# Patient Record
Sex: Female | Born: 1995 | Race: Black or African American | Hispanic: No | Marital: Single | Smoking: Never smoker
Health system: Southern US, Community
[De-identification: ages and names within clinical notes are randomized; demographics above are authoritative.]

## PROBLEM LIST (undated history)

## (undated) DIAGNOSIS — L309 Dermatitis, unspecified: Secondary | ICD-10-CM

## (undated) DIAGNOSIS — L509 Urticaria, unspecified: Secondary | ICD-10-CM

## (undated) DIAGNOSIS — Z91018 Allergy to other foods: Secondary | ICD-10-CM

## (undated) DIAGNOSIS — J45909 Unspecified asthma, uncomplicated: Secondary | ICD-10-CM

## (undated) HISTORY — DX: Dermatitis, unspecified: L30.9

## (undated) HISTORY — PX: TONSILLECTOMY: SUR1361

## (undated) HISTORY — DX: Urticaria, unspecified: L50.9

## (undated) HISTORY — DX: Allergy to other foods: Z91.018

---

## 2016-06-10 ENCOUNTER — Encounter: Payer: Self-pay | Admitting: Student

## 2016-06-10 ENCOUNTER — Ambulatory Visit (INDEPENDENT_AMBULATORY_CARE_PROVIDER_SITE_OTHER): Payer: Managed Care, Other (non HMO) | Admitting: Student

## 2016-06-10 ENCOUNTER — Other Ambulatory Visit: Payer: Self-pay

## 2016-06-10 VITALS — BP 110/70 | Ht 68.0 in | Wt 158.0 lb

## 2016-06-10 DIAGNOSIS — M25532 Pain in left wrist: Secondary | ICD-10-CM | POA: Diagnosis not present

## 2016-06-10 DIAGNOSIS — M25539 Pain in unspecified wrist: Secondary | ICD-10-CM

## 2016-06-10 DIAGNOSIS — M2550 Pain in unspecified joint: Secondary | ICD-10-CM | POA: Insufficient documentation

## 2016-06-10 DIAGNOSIS — Z832 Family history of diseases of the blood and blood-forming organs and certain disorders involving the immune mechanism: Secondary | ICD-10-CM

## 2016-06-10 DIAGNOSIS — M654 Radial styloid tenosynovitis [de Quervain]: Secondary | ICD-10-CM | POA: Diagnosis not present

## 2016-06-10 DIAGNOSIS — Z8269 Family history of other diseases of the musculoskeletal system and connective tissue: Secondary | ICD-10-CM | POA: Insufficient documentation

## 2016-06-10 LAB — COMPREHENSIVE METABOLIC PANEL
ALT: 10 U/L (ref 5–32)
AST: 15 U/L (ref 12–32)
Albumin: 4.1 g/dL (ref 3.6–5.1)
Alkaline Phosphatase: 53 U/L (ref 47–176)
BUN: 7 mg/dL (ref 7–20)
CHLORIDE: 107 mmol/L (ref 98–110)
CO2: 25 mmol/L (ref 20–31)
Calcium: 9.8 mg/dL (ref 8.9–10.4)
Creat: 0.74 mg/dL (ref 0.50–1.00)
GLUCOSE: 90 mg/dL (ref 65–99)
POTASSIUM: 4.4 mmol/L (ref 3.8–5.1)
Sodium: 140 mmol/L (ref 135–146)
Total Bilirubin: 0.6 mg/dL (ref 0.2–1.1)
Total Protein: 6.7 g/dL (ref 6.3–8.2)

## 2016-06-10 LAB — CBC
HEMATOCRIT: 39 % (ref 35.0–45.0)
HEMOGLOBIN: 12.6 g/dL (ref 11.7–15.5)
MCH: 27 pg (ref 27.0–33.0)
MCHC: 32.3 g/dL (ref 32.0–36.0)
MCV: 83.5 fL (ref 80.0–100.0)
MPV: 10.4 fL (ref 7.5–12.5)
Platelets: 411 10*3/uL — ABNORMAL HIGH (ref 140–400)
RBC: 4.67 MIL/uL (ref 3.80–5.10)
RDW: 15 % (ref 11.0–15.0)
WBC: 5 10*3/uL (ref 3.8–10.8)

## 2016-06-10 MED ORDER — PILOCARPINE HCL 5 MG PO TABS: 5.0000 mg | ORAL_TABLET | Freq: Two times a day (BID) | ORAL | 1 refills | Status: DC

## 2016-06-10 MED ORDER — MELOXICAM 15 MG PO TABS: 15.0000 mg | ORAL_TABLET | Freq: Every day | ORAL | 0 refills | Status: AC

## 2016-06-10 NOTE — Assessment & Plan Note (Signed)
Ultrasound did show swelling of the first compartment. 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 We'll place her in a thumb spica splint and recommend no practice for the next 1-2 weeks. Will follow-up next week to reassess. [facility] Did review more aggressive management that includes an injection. Decided this time to not go forward with the injection but will consider it if rest and NSAIDs and ice do not help.

## 2016-06-10 NOTE — Assessment & Plan Note (Signed)
Due to her pain in multiple joints and family history of lupus, we will check lab work including an ANA, rheumatoid factor, CBC, CMP, ESR, CRP, anti-double-stranded DNA, anti-CCP, anti-Ro and anti-La.  will follow up with her next week and training room to discuss results.  May consider rheumatology referral of continuing to have in terms. She also had decreased elevation which could be indicative of Sjogren's. Will consider sending her to Dr. Berna Bue for Dr. Leigh Aurora.  Will prescribe Salagen for her decreased salivation.

## 2016-06-10 NOTE — Progress Notes (Signed)
Christina Cain - 20 y.o. female MRN [id number]  Date of birth: January 24, 1996  SUBJECTIVE:  Including CC & ROS.  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   She states that it became very swollen and tender. She was unable to practice after that. She also complains of right shoulder pain and left ankle pain. She states that her mother had lupus and she presented similarly. She denies any rashes. She denies any fevers or chills or changes in weight.  She does report decreased salivation but denies any dry eyes. She has had recent cavities and problems with dentition.   denies any numbness or tingling distally. Her right shoulder is not bothering her at this time. Her left ankle hurts posteriorly only when she is exercising a lot. No swelling of her ankle at this time but she did have previous swelling there.   ROS: No unexpected weight loss, fever, chills, swelling, instability, muscle pain, numbness/tingling, redness, otherwise see HPI   PMHx - Updated and reviewed.  Contributory factors include: Negative PSHx - Updated and reviewed.  Contributory factors include:  Negative FHx - Updated and reviewed.  Contributory factors include:   significant for lupus in her mother Social Hx - Updated and reviewed. Contributory factors include: Negative Medications - reviewed   DATA REVIEWED: none  Ultrasound: Limited ultrasound of the left wrist was performed. First compartment was viewed and long and short axis was just revealed swelling around the abductor pollicis longus and extensor pollicis brevis tendons. This was viewed and long and short axis. This is consistent with de Quervain's tenosynovitis.  PHYSICAL EXAM:  VS: BP:110/70  HR: bpm  TEMP: ( )  RESP:   HT:5' 8"  (172.7 cm)   WT:158 lb (71.7 kg)  BMI:24.1 PHYSICAL EXAM: Gen: NAD, alert, cooperative with exam, well-appearing HEENT: clear conjunctiva,  CV:  no edema, capillary  refill brisk, normal rate Resp: non-labored Skin: no rashes, normal turgor  Neuro: no gross deficits.  Psych:  alert and oriented  Wrist: No swelling or effusion seen on inspection Tenderness to palpation on the lateral aspect of the wrist. Not tender in the anatomic snuffbox. Did have some tenderness proximal to the dorsal aspect of her carpal bones on the radial side. She has full range of motion of her bilateral wrists in flexion, extension, supination, pronation, ulnar and radial deviation. Finkelstein's test positive Grip strength equal on both sides   ASSESSMENT & PLAN:   De Quervain's tenosynovitis, left Ultrasound did show swelling of the first compartment. 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 We'll place her in a thumb spica splint and recommend no practice for the next 1-2 weeks. Will follow-up next week to reassess. Did send in a prescription for [facility] for anti-inflammatories. Did review more aggressive management that includes an injection. Decided this time to not go forward with the injection but will consider it if rest and NSAIDs and ice do not help.  Pain in joint involving multiple sites Due to her pain in multiple joints and family history of lupus, we will check lab work including an ANA, rheumatoid factor, CBC, CMP, ESR, CRP, anti-double-stranded DNA, anti-CCP, anti-Ro and anti-La.  will follow up with her next week and training room to discuss results.  May consider rheumatology referral of continuing to have in terms. She also had decreased elevation which could be indicative of Sjogren's. Will consider sending her to Dr. Berna Bue for  Dr. Leigh Aurora.  Will prescribe Salagen for her decreased salivation.

## 2016-06-11 LAB — ANTI-NUCLEAR AB-TITER (ANA TITER)

## 2016-06-11 LAB — RHEUMATOID FACTOR

## 2016-06-11 LAB — SJOGREN'S SYNDROME ANTIBODS(SSA + SSB)
SSA (Ro) (ENA) Antibody, IgG: <1
SSB (La) (ENA) Antibody, IgG: <1

## 2016-06-11 LAB — ANTI-DNA ANTIBODY, DOUBLE-STRANDED: ds DNA Ab: <1 IU/mL

## 2016-06-11 LAB — C-REACTIVE PROTEIN: CRP: 0.9 mg/L (ref <8.0)

## 2016-06-11 LAB — SEDIMENTATION RATE: Sed Rate: 4 mm/hr (ref 0–20)

## 2016-06-11 LAB — ANA: Anti Nuclear Antibody(ANA): POSITIVE — AB

## 2016-06-11 LAB — CYCLIC CITRUL PEPTIDE ANTIBODY, IGG

## 2016-06-16 ENCOUNTER — Other Ambulatory Visit: Payer: Self-pay | Admitting: *Deleted

## 2016-06-16 DIAGNOSIS — Z8269 Family history of other diseases of the musculoskeletal system and connective tissue: Secondary | ICD-10-CM

## 2016-06-16 NOTE — Progress Notes (Signed)
We are referring pt to rheumatology- preferably Dierdre ForthBeekman or Nickola MajorHawkes? for +ANA and FHx of lupus. Will fax notes and demos to Lawson FiscalLori at Rheumatology at [phone]. They will call patient with appt time and date. Their office number is [phone]

## 2016-07-13 ENCOUNTER — Emergency Department (HOSPITAL_COMMUNITY): Payer: Managed Care, Other (non HMO)

## 2016-07-13 ENCOUNTER — Emergency Department (HOSPITAL_COMMUNITY)
Admission: EM | Admit: 2016-07-13 | Discharge: 2016-07-13 | Disposition: A | Discharge status: Home / Self Care | Payer: Managed Care, Other (non HMO) | Attending: Emergency Medicine | Admitting: Emergency Medicine

## 2016-07-13 ENCOUNTER — Encounter (HOSPITAL_COMMUNITY): Payer: Self-pay | Admitting: *Deleted

## 2016-07-13 DIAGNOSIS — R0789 Other chest pain: Secondary | ICD-10-CM | POA: Diagnosis not present

## 2016-07-13 DIAGNOSIS — Y929 Unspecified place or not applicable: Secondary | ICD-10-CM | POA: Diagnosis not present

## 2016-07-13 DIAGNOSIS — Y9368 Activity, volleyball (beach) (court): Secondary | ICD-10-CM | POA: Diagnosis not present

## 2016-07-13 DIAGNOSIS — R109 Unspecified abdominal pain: Secondary | ICD-10-CM | POA: Diagnosis present

## 2016-07-13 DIAGNOSIS — W2106XA Struck by volleyball, initial encounter: Secondary | ICD-10-CM | POA: Insufficient documentation

## 2016-07-13 DIAGNOSIS — Y999 Unspecified external cause status: Secondary | ICD-10-CM | POA: Diagnosis not present

## 2016-07-13 LAB — URINALYSIS, ROUTINE W REFLEX MICROSCOPIC
BILIRUBIN URINE: NEGATIVE
Glucose, UA: NEGATIVE mg/dL
Hgb urine dipstick: NEGATIVE
KETONES UR: NEGATIVE mg/dL
Leukocytes, UA: NEGATIVE
NITRITE: NEGATIVE
PH: 5.5 (ref 5.0–8.0)
Protein, ur: NEGATIVE mg/dL
Specific Gravity, Urine: 1.019 (ref 1.005–1.030)

## 2016-07-13 LAB — CBC
HCT: 35.8 % — ABNORMAL LOW (ref 36.0–46.0)
HEMOGLOBIN: 11.5 g/dL — AB (ref 12.0–15.0)
MCH: 26.6 pg (ref 26.0–34.0)
MCHC: 32.1 g/dL (ref 30.0–36.0)
MCV: 82.7 fL (ref 78.0–100.0)
Platelets: 397 10*3/uL (ref 150–400)
RBC: 4.33 MIL/uL (ref 3.87–5.11)
RDW: 14.1 % (ref 11.5–15.5)
WBC: 8.7 10*3/uL (ref 4.0–10.5)

## 2016-07-13 LAB — I-STAT TROPONIN, ED: TROPONIN I, POC: 0 ng/mL (ref 0.00–0.08)

## 2016-07-13 LAB — COMPREHENSIVE METABOLIC PANEL
ALT: 19 U/L (ref 14–54)
ANION GAP: 10 (ref 5–15)
AST: 29 U/L (ref 15–41)
Albumin: 4.3 g/dL (ref 3.5–5.0)
Alkaline Phosphatase: 56 U/L (ref 38–126)
BILIRUBIN TOTAL: 0.7 mg/dL (ref 0.3–1.2)
BUN: 10 mg/dL (ref 6–20)
CO2: 26 mmol/L (ref 22–32)
Calcium: 10.1 mg/dL (ref 8.9–10.3)
Chloride: 104 mmol/L (ref 101–111)
Creatinine, Ser: 0.79 mg/dL (ref 0.44–1.00)
Glucose, Bld: 96 mg/dL (ref 65–99)
POTASSIUM: 3.8 mmol/L (ref 3.5–5.1)
Sodium: 140 mmol/L (ref 135–145)
TOTAL PROTEIN: 7 g/dL (ref 6.5–8.1)

## 2016-07-13 LAB — I-STAT BETA HCG BLOOD, ED (MC, WL, AP ONLY): I-stat hCG, quantitative: <5 m[IU]/mL (ref <5)

## 2016-07-13 NOTE — ED Notes (Addendum)
Pt provided with d/c instructions at this time. Pt verbalizes understanding of d/c instructions as well as follow up procedure after d/c. No new RX at time of d/c.  Pt in no apparent distress at this time. Pt ambulatory at time of d/c.   

## 2016-07-13 NOTE — ED Triage Notes (Signed)
Patient presents with c/o chest pain.  Patient was playing volleyball and got hit last Thursday.  Patient c/o pain to the central chest area.  Was seen by Armen PickupMurphy Wanier PA and was sent here to have a CT done to make sure she did not have a broken rib and haas a lung or heart contusion  Patient states she cannot take a deep breath

## 2016-07-13 NOTE — ED Provider Notes (Signed)
[facility] Provider Note   CSN: [id number] Arrival date & time: 07/13/16  1957     History   Chief Complaint Chief Complaint  Patient presents with  . Chest Pain    HPI Christina Cain is a 20 y.o. female.  HPI   20 year old female presents today with complaints of chest pain. Patient reports that 6 days ago she was playing volleyball and was struck in the chest by volleyball. She had since that time she's had anterior chest pain located over the left upper breast. She reports that she has been able to practice, but has significant pain with respirations and palpitations. She reports. Minimal pain with small respirations, denies any sushi and shortness of breath. She has any abdominal pain, dizziness, lightheadedness. Seen by orthopedics who recommended chest x-ray. She denies any other complaints, currently taking meloxicam.    History reviewed. No pertinent past medical history.  Patient Active Problem List   Diagnosis Date Noted  . De Quervain's tenosynovitis, left 06/10/2016  . Pain in joint involving multiple sites 06/10/2016  . Family history of systemic lupus erythematosus (SLE) in mother 06/10/2016    Past Surgical History:  Procedure Laterality Date  . TONSILLECTOMY      OB History    No data available       Home Medications    Prior to Admission medications   Medication Sig Start Date End Date Taking? Authorizing Provider  desogestrel-ethinyl estradiol (APRI,EMOQUETTE,SOLIA) 0.15-30 MG-MCG tablet Take by mouth. 03/23/16   Historical Provider, MD  EPINEPHrine 0.3 mg/0.3 mL IJ SOAJ injection EPIPEN 2-PAK 0.3 MG/0.3ML SOAJ                      02/17/15   Historical Provider, MD  meloxicam (MOBIC) 15 MG tablet Take 1 tablet (15 mg total) by mouth daily. 06/10/16 06/10/17  Alicia B Chitanand, DO  pilocarpine (SALAGEN) 5 MG tablet Take 1 tablet (5 mg total) by mouth 2 (two) times daily. 06/10/16   Gershon Cull, DO    Family History No family history  on file.  Social History Social History  Substance Use Topics  . Smoking status: Never Smoker  . Smokeless tobacco: Never Used  . Alcohol use No     Allergies   Shrimp [shellfish allergy]   Review of Systems Review of Systems  All other systems reviewed and are negative.   Physical Exam Updated Vital Signs BP 98/60   Pulse 65   Temp 98.9 F (37.2 C)   Resp 18   Ht 5\' 8"  (1.727 m)   Wt 72.6 kg   LMP 07/10/2016   SpO2 95%   BMI 24.33 kg/m   Physical Exam  Constitutional: She is oriented to person, place, and time. She appears well-developed and well-nourished.  HENT:  Head: Normocephalic and atraumatic.  Eyes: Conjunctivae are normal. Pupils are equal, round, and reactive to light. Right eye exhibits no discharge. Left eye exhibits no discharge. No scleral icterus.  Neck: Normal range of motion. No JVD present. No tracheal deviation present.  Cardiovascular: Normal rate, regular rhythm, normal heart sounds and intact distal pulses.  Exam reveals no gallop and no friction rub.   No murmur heard. Pulmonary/Chest: Effort normal. No stridor.  No obvious signs of trauma, tenderness to palpation of the left upper chest and superior breast., Lung expansion normal, lungs sounds clear and heard throughout.  Neurological: She is alert and oriented to person, place, and time. Coordination normal.  Psychiatric: She  has a normal mood and affect. Her behavior is normal. Judgment and thought content normal.  Nursing note and vitals reviewed.    ED Treatments / Results  Labs (all labs ordered are listed, but only abnormal results are displayed) Labs Reviewed  CBC - Abnormal; Notable for the following:       Result Value   Hemoglobin 11.5 (*)    HCT 35.8 (*)    All other components within normal limits  URINALYSIS, ROUTINE W REFLEX MICROSCOPIC (NOT AT [facility]) - Abnormal; Notable for the following:    Color, Urine AMBER (*)    All other components within normal limits    COMPREHENSIVE METABOLIC PANEL  I-STAT TROPOININ, ED  I-STAT BETA HCG BLOOD, ED ([facility], [facility], [facility] ONLY)    EKG  EKG Interpretation None       Radiology Dg Chest 2 View  Result Date: 07/13/2016 CLINICAL DATA:  Central chest pain and shortness of breath over the last 5 days after being struck in the chest by a volleyball. EXAM: CHEST  2 VIEW COMPARISON:  None. FINDINGS: Heart size is normal. Mediastinal shadows are normal. The lungs are clear. No bronchial thickening. No infiltrate, mass, effusion or collapse. Pulmonary vascularity is normal. No acute bone finding. Very mild spinal curvature. IMPRESSION: Normal chest. I cannot identify any sternal or anterior rib abnormality on this exam. The patient does show very mild spinal curvature. Electronically Signed   By: Paulina FusiMark  Shogry M.D.   On: 07/13/2016 20:53    Procedures Procedures (including critical care time)  Medications Ordered in ED Medications - No data to display   Initial Impression / Assessment and Plan / ED Course  I have reviewed the triage vital signs and the nursing notes.  Pertinent labs & imaging results that were available during my care of the patient were reviewed by me and considered in my medical decision making (see chart for details).  Clinical Course     Final Clinical Impressions(s) / ED Diagnoses   Final diagnoses:  Chest wall pain   20 year old female presents today with musculoskeletal chest pain. She was struck by a volleyball, has been stable since. She has tenderness to palpation, no respiratory involvement. X-ray normal, she'll be discharged home with symptomatic care instructions and strict return precautions.  New Prescriptions Discharge Medication List as of 07/13/2016 11:11 PM       Eyvonne MechanicJeffrey Agnes Brightbill, PA-C 07/14/16 [id number]    Shaune Pollackameron Isaacs, MD 07/15/16 1455

## 2016-07-13 NOTE — Discharge Instructions (Signed)
Please read attached information. Please use ice, 600 mg of ibuprofen 3 times daily as needed for discomfort. Please monitor for any new or worsening signs or symptoms and return immediately to the emergency room if you experience any. If you continue to experience pain beyond 2 weeks please follow-up with memory care provider for reevaluation.

## 2018-02-25 IMAGING — DX DG CHEST 2V
2 series · 2 of 2 positions shown · non-contrast
Comparison: None.

CLINICAL DATA: Central chest pain and shortness of breath over the
last 5 days after being struck in the chest by a volleyball.

EXAM:
CHEST  2 VIEW

[w chest pa]
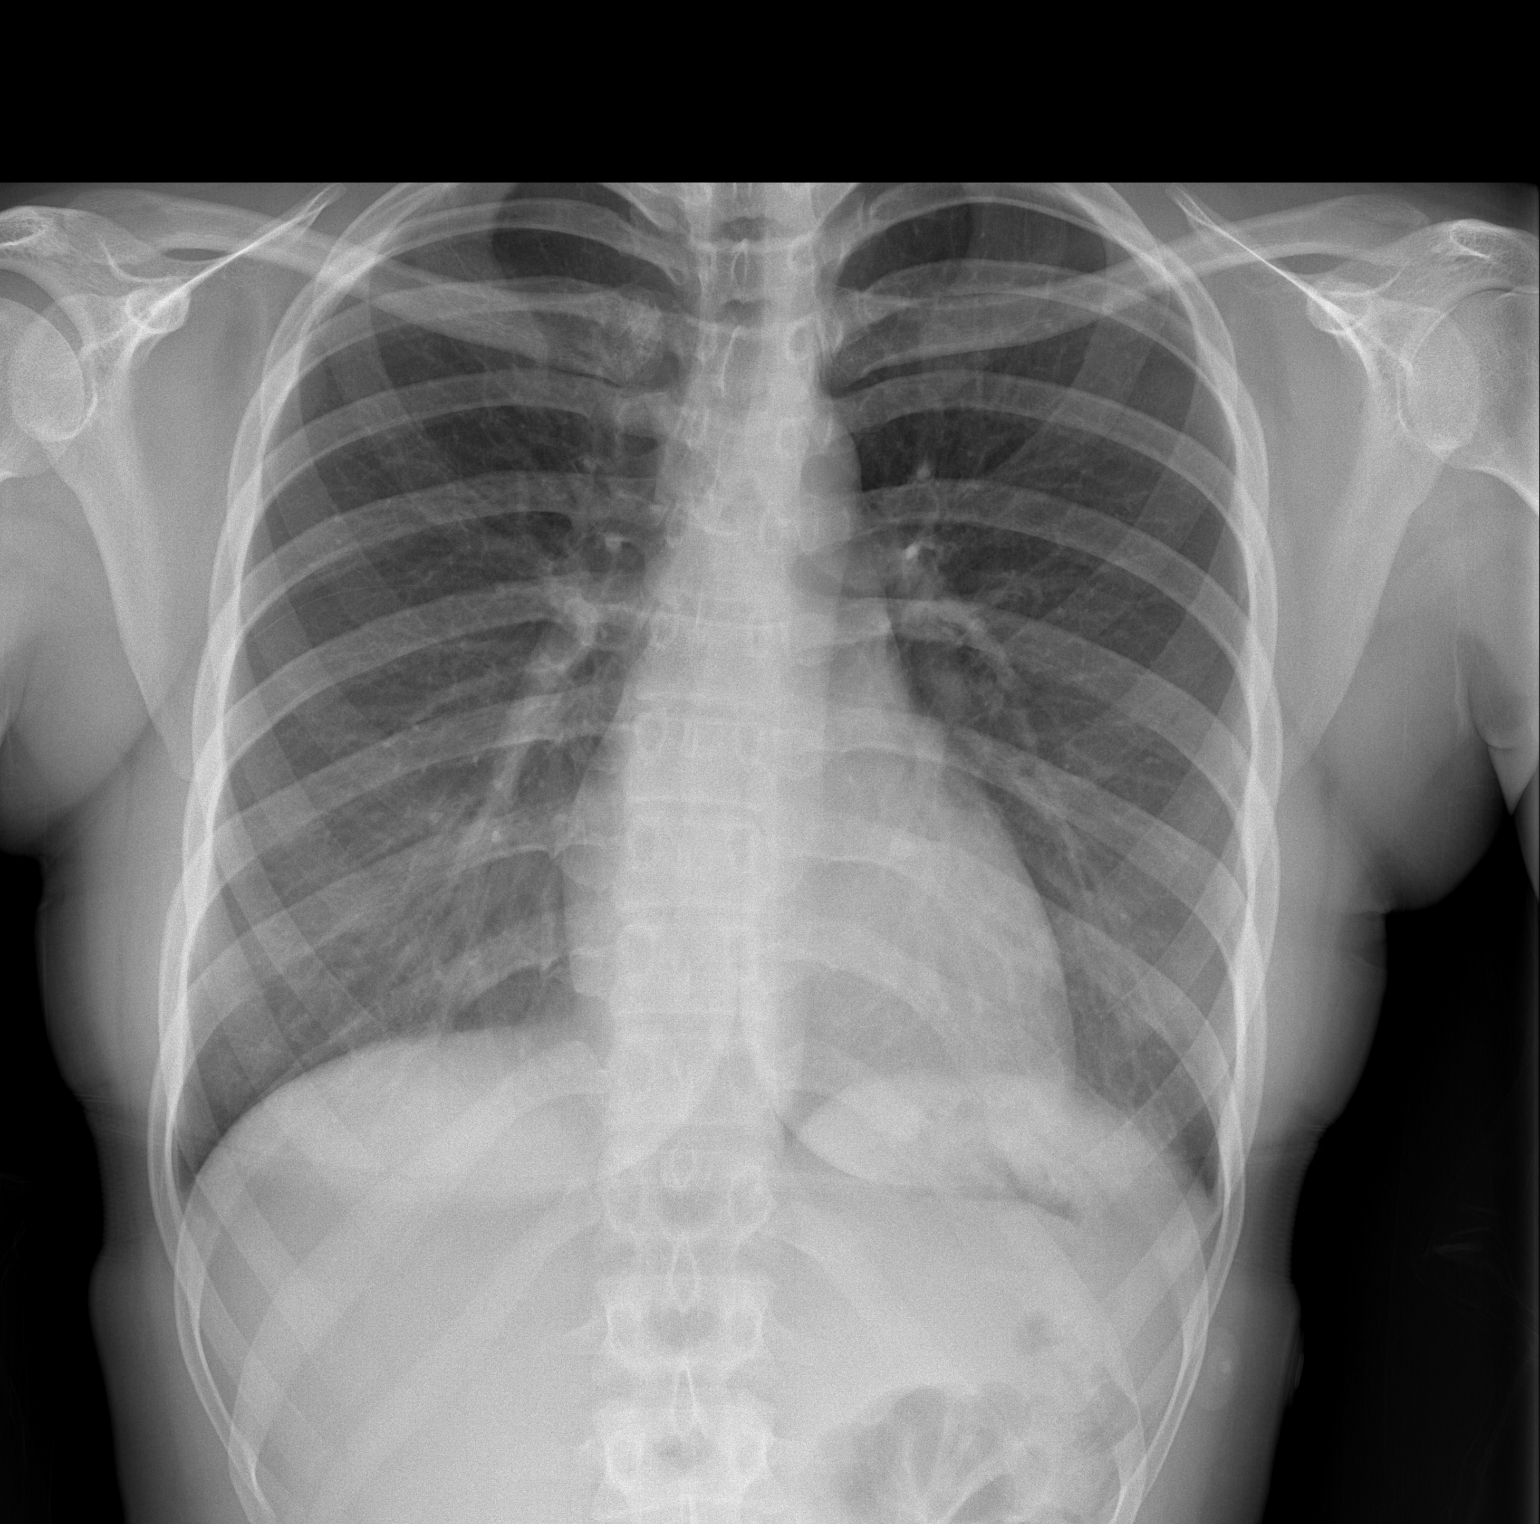

[w chest lat]
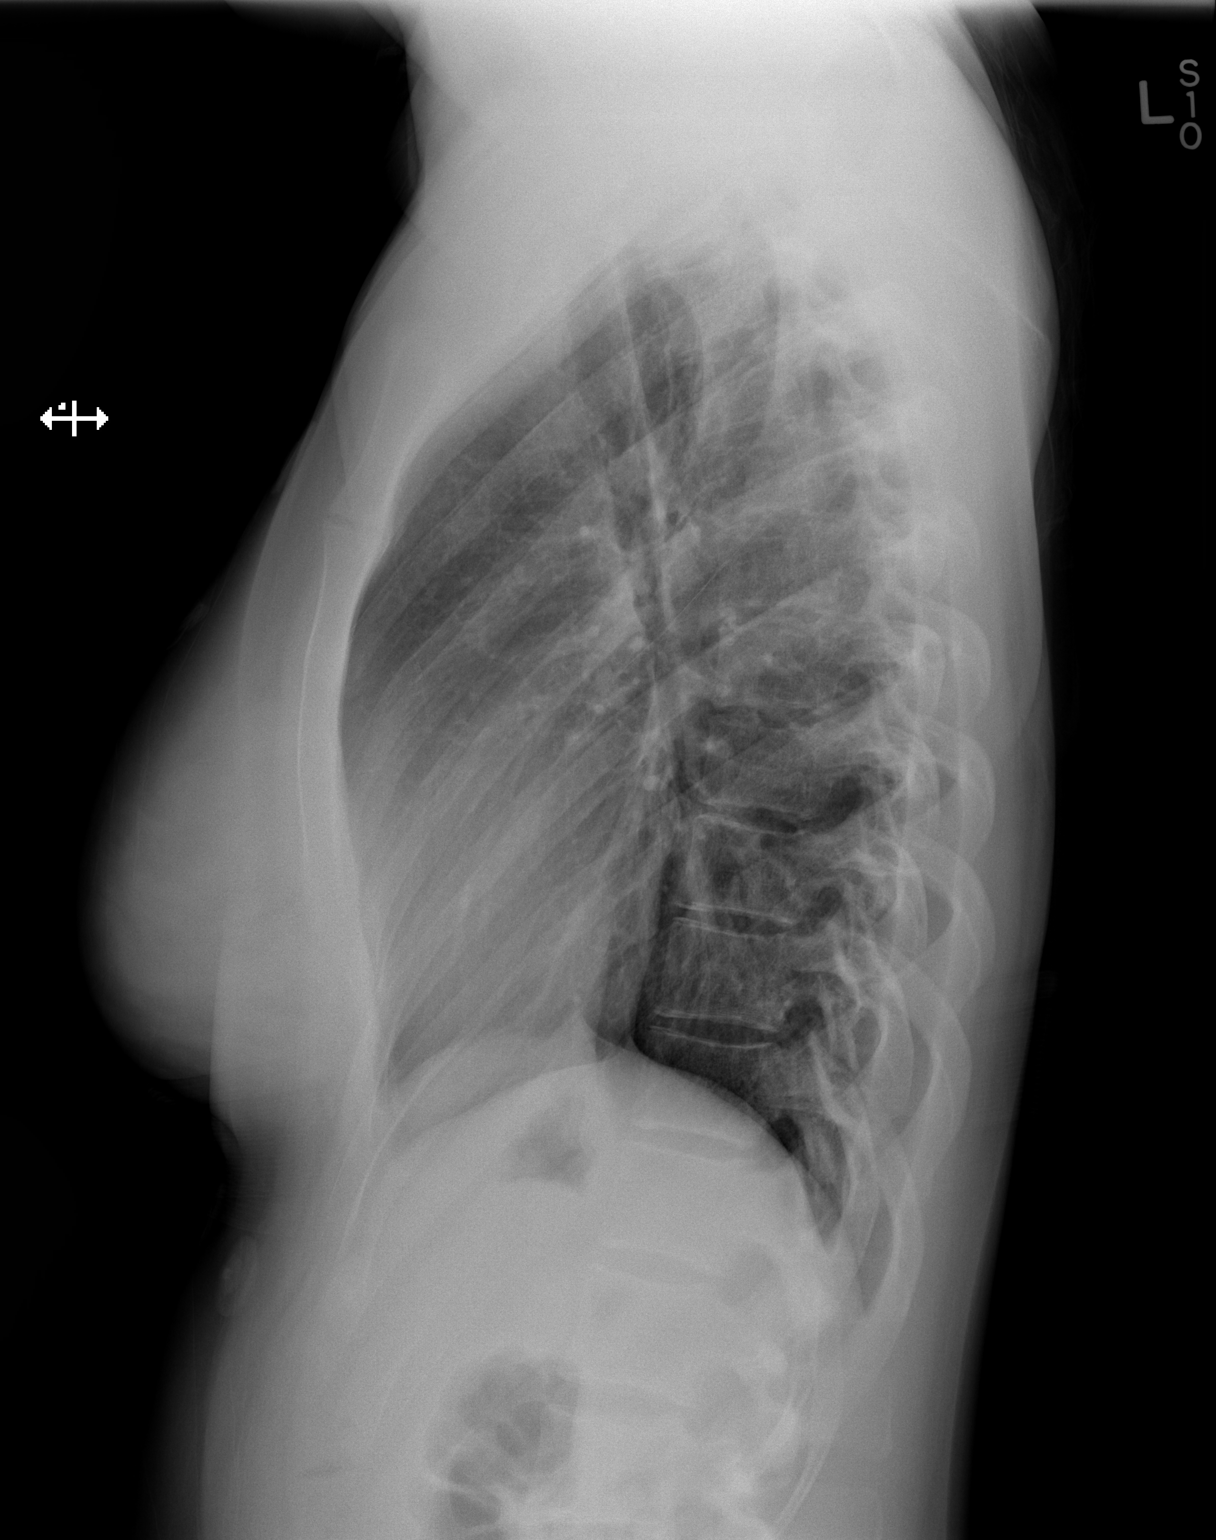

[2 of 2 positions shown; findings below may reference images not displayed]

FINDINGS: Heart size is normal. Mediastinal shadows are normal. The lungs are
clear. No bronchial thickening. No infiltrate, mass, effusion or
collapse. Pulmonary vascularity is normal. No acute bone finding.
Very mild spinal curvature.
IMPRESSION: Normal chest. I cannot identify any sternal or anterior rib
abnormality on this exam. The patient does show very mild spinal
curvature.

## 2018-12-28 ENCOUNTER — Other Ambulatory Visit: Payer: Self-pay

## 2018-12-28 ENCOUNTER — Ambulatory Visit (HOSPITAL_COMMUNITY)
Admission: EM | Admit: 2018-12-28 | Discharge: 2018-12-28 | Disposition: A | Discharge status: Home / Self Care | Payer: BLUE CROSS/BLUE SHIELD | Attending: Internal Medicine | Admitting: Internal Medicine

## 2018-12-28 ENCOUNTER — Encounter (HOSPITAL_COMMUNITY): Payer: Self-pay

## 2018-12-28 DIAGNOSIS — J4521 Mild intermittent asthma with (acute) exacerbation: Secondary | ICD-10-CM | POA: Diagnosis not present

## 2018-12-28 DIAGNOSIS — Z7189 Other specified counseling: Secondary | ICD-10-CM

## 2018-12-28 DIAGNOSIS — R05 Cough: Secondary | ICD-10-CM | POA: Diagnosis not present

## 2018-12-28 DIAGNOSIS — R509 Fever, unspecified: Secondary | ICD-10-CM | POA: Diagnosis not present

## 2018-12-28 DIAGNOSIS — J302 Other seasonal allergic rhinitis: Secondary | ICD-10-CM

## 2018-12-28 HISTORY — DX: Unspecified asthma, uncomplicated: J45.909

## 2018-12-28 MED ORDER — ALBUTEROL SULFATE HFA 108 (90 BASE) MCG/ACT IN AERS: 1.0000 | INHALATION_SPRAY | Freq: Four times a day (QID) | RESPIRATORY_TRACT | 0 refills | Status: DC | PRN

## 2018-12-28 MED ORDER — ACETAMINOPHEN 500 MG PO TABS: 500.0000 mg | ORAL_TABLET | Freq: Four times a day (QID) | ORAL | Status: AC | PRN

## 2018-12-28 MED ORDER — CETIRIZINE HCL 10 MG PO CHEW: 10.0000 mg | CHEWABLE_TABLET | Freq: Every day | ORAL | 2 refills | Status: AC

## 2018-12-28 NOTE — ED Triage Notes (Signed)
Reports fever beginning yesterday with dry cough,H/O ASTHMA

## 2018-12-28 NOTE — ED Provider Notes (Addendum)
[facility]    CSN: [id number] Arrival date & time: 12/28/18  1752     History   Chief Complaint Fever and cough  HPI Christina Cain is a 23 y.o. female with a history of asthma comes to urgent care with complaints of low-grade fever (99.6 Fahrenheit), nonproductive cough without sore throat of 2 days duration.   Patient has been no significant shortness of breath or wheezing.  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  She admits contact with friends who recently returned from [place] whilst on spring break.  She has allergies but denies any itching eyes or nose.  She has some nasal congestion with no postnasal drip.  HPI  Past Medical History:  Diagnosis Date  . Asthma     Patient Active Problem List   Diagnosis Date Noted  . De Quervain's tenosynovitis, left 06/10/2016  . Pain in joint involving multiple sites 06/10/2016  . Family history of systemic lupus erythematosus (SLE) in mother 06/10/2016    Past Surgical History:  Procedure Laterality Date  . TONSILLECTOMY      OB History   No obstetric history on file.      Home Medications    Prior to Admission medications   Medication Sig Start Date End Date Taking? Authorizing Provider  acetaminophen (TYLENOL) 500 MG tablet Take 1 tablet (500 mg total) by mouth every 6 (six) hours as needed for mild pain. 12/28/18   , Britta Mccreedy, MD  albuterol (PROVENTIL HFA;VENTOLIN HFA) 108 (90 Base) MCG/ACT inhaler Inhale 1-2 puffs into the lungs every 6 (six) hours as needed for wheezing or shortness of breath. 12/28/18   , Britta Mccreedy, MD  cetirizine (ZYRTEC) 10 MG chewable tablet Chew 1 tablet (10 mg total) by mouth daily. 12/28/18   , Britta Mccreedy, MD    Family History Family History  Family history unknown: Yes    Social History Social History   Tobacco Use  . Smoking status: Never Smoker  . Smokeless tobacco: Never Used  Substance Use Topics  . Alcohol use: Yes  . Drug use: No     Allergies   Shrimp  [shellfish allergy]   Review of Systems Review of Systems  Constitutional: Positive for activity change, chills and fever. Negative for fatigue.  HENT: Positive for rhinorrhea. Negative for postnasal drip, sinus pressure, sinus pain and sore throat.   Eyes: Negative for itching.  Respiratory: Positive for cough. Negative for chest tightness, shortness of breath and wheezing.   Genitourinary: Negative for dysuria, frequency and urgency.  Musculoskeletal: Negative for arthralgias and myalgias.  Skin: Negative for rash and wound.  Neurological: Negative for weakness, light-headedness and headaches.  Psychiatric/Behavioral: Negative for confusion.  All other systems reviewed and are negative.    Physical Exam Triage Vital Signs ED Triage Vitals  Enc Vitals Group     BP 12/28/18 1855 113/63     Pulse Rate 12/28/18 1855 76     Resp --      Temp 12/28/18 1855 99.5 F (37.5 C)     Temp Source 12/28/18 1855 Oral     SpO2 12/28/18 1855 100 %     Weight --      Height --      Head Circumference --      Peak Flow --      Pain Score 12/28/18 1857 0     Pain Loc --      Pain Edu? --      Excl. in GC? --  No data found.  Updated Vital Signs BP 113/63 (BP Location: Right Arm)   Pulse 76   Temp 99.5 F (37.5 C) (Oral)   SpO2 100%   Visual Acuity Right Eye Distance:   Left Eye Distance:   Bilateral Distance:    Right Eye Near:   Left Eye Near:    Bilateral Near:     Physical Exam Vitals signs and nursing note reviewed.  Constitutional:      Appearance: Normal appearance.  HENT:     Right Ear: Tympanic membrane normal.     Left Ear: Tympanic membrane normal.     Nose: Nose normal.     Mouth/Throat:     Pharynx: Posterior oropharyngeal erythema present.  Eyes:     General:        Right eye: No discharge.        Left eye: No discharge.     Conjunctiva/sclera: Conjunctivae normal.  Neck:     Musculoskeletal: No neck rigidity.  Cardiovascular:     Rate and Rhythm:  Normal rate and regular rhythm.     Pulses: Normal pulses.     Heart sounds: Normal heart sounds.  Pulmonary:     Effort: Pulmonary effort is normal. No respiratory distress.     Breath sounds: Normal breath sounds. No wheezing or rales.  Abdominal:     General: Bowel sounds are normal.     Palpations: Abdomen is soft.  Musculoskeletal: Normal range of motion.        General: No swelling.  Lymphadenopathy:     Cervical: No cervical adenopathy.  Skin:    General: Skin is warm.     Capillary Refill: Capillary refill takes less than 2 seconds.  Neurological:     General: No focal deficit present.     Mental Status: She is alert and oriented to person, place, and time.      UC Treatments / Results  Labs (all labs ordered are listed, but only abnormal results are displayed) Labs Reviewed - No data to display  EKG None  Radiology No results found.  Procedures Procedures (including critical care time)  Medications Ordered in UC Medications - No data to display  Initial Impression / Assessment and Plan / UC Course  I have reviewed the triage vital signs and the nursing notes.  Pertinent labs & imaging results that were available during my care of the patient were reviewed by me and considered in my medical decision making (see chart for details).    1.  Fever and nonproductive cough: This is worrisome for COVID 19 infection Patient is counseled to self isolate Patient will get a call back on day 5 to assess her symptoms She has been given guidance as to call back if her symptoms worsens over the weekend  2.  Mild intermittent asthma with exacerbation: Albuterol inhaler  3.  Seasonal allergies Zyrtec Final Clinical Impressions(s) / UC Diagnoses   Final diagnoses:  Mild intermittent asthma with exacerbation  Seasonal allergies  Educated About Covid-19 Virus Infection   Discharge Instructions   None    ED Prescriptions    Medication Sig Dispense Auth.  Provider   albuterol (PROVENTIL HFA;VENTOLIN HFA) 108 (90 Base) MCG/ACT inhaler Inhale 1-2 puffs into the lungs every 6 (six) hours as needed for wheezing or shortness of breath. 1 Inhaler , Britta Mccreedy, MD   acetaminophen (TYLENOL) 500 MG tablet Take 1 tablet (500 mg total) by mouth every 6 (six) hours as needed for mild  pain. 30 tablet , Britta Mccreedy O, MD   cetirizine (ZYRTEC) 10 MG chewable tablet Chew 1 tablet (10 mg total) by mouth daily. 30 tablet , Britta Mccreedy O, MD     Controlled Substance Prescriptions [place] Controlled Substance Registry consulted? No   Merrilee Jansky,  O, MD 12/28/18 Nolen [id number]    Merrilee Jansky,  O, MD 12/28/18 Sable [id number]    Merrilee Jansky,  O, MD 12/29/18 1001

## 2019-05-16 ENCOUNTER — Encounter: Payer: Self-pay | Admitting: Allergy and Immunology

## 2019-05-16 ENCOUNTER — Ambulatory Visit (INDEPENDENT_AMBULATORY_CARE_PROVIDER_SITE_OTHER): Payer: BC Managed Care – PPO | Admitting: Allergy and Immunology

## 2019-05-16 ENCOUNTER — Other Ambulatory Visit: Payer: Self-pay

## 2019-05-16 VITALS — BP 132/76 | HR 68 | Temp 98.4°F | Resp 16 | Ht 67.0 in | Wt 173.3 lb

## 2019-05-16 DIAGNOSIS — H1013 Acute atopic conjunctivitis, bilateral: Secondary | ICD-10-CM

## 2019-05-16 DIAGNOSIS — J4530 Mild persistent asthma, uncomplicated: Secondary | ICD-10-CM

## 2019-05-16 DIAGNOSIS — J3089 Other allergic rhinitis: Secondary | ICD-10-CM

## 2019-05-16 DIAGNOSIS — T7800XD Anaphylactic reaction due to unspecified food, subsequent encounter: Secondary | ICD-10-CM

## 2019-05-16 DIAGNOSIS — T7800XA Anaphylactic reaction due to unspecified food, initial encounter: Secondary | ICD-10-CM | POA: Insufficient documentation

## 2019-05-16 DIAGNOSIS — L2089 Other atopic dermatitis: Secondary | ICD-10-CM

## 2019-05-16 DIAGNOSIS — H1010 Acute atopic conjunctivitis, unspecified eye: Secondary | ICD-10-CM | POA: Insufficient documentation

## 2019-05-16 MED ORDER — DESONIDE 0.05 % EX OINT: TOPICAL_OINTMENT | CUTANEOUS | 3 refills | Status: AC

## 2019-05-16 MED ORDER — ALBUTEROL SULFATE HFA 108 (90 BASE) MCG/ACT IN AERS
2.0000 | INHALATION_SPRAY | Freq: Four times a day (QID) | RESPIRATORY_TRACT | 1 refills | Status: AC | PRN
Start: 2019-05-16 — End: ?

## 2019-05-16 MED ORDER — LEVOCETIRIZINE DIHYDROCHLORIDE 5 MG PO TABS: 5.0000 mg | ORAL_TABLET | Freq: Every evening | ORAL | 5 refills | Status: AC

## 2019-05-16 MED ORDER — FLUTICASONE PROPIONATE 50 MCG/ACT NA SUSP: 2.0000 | Freq: Every day | NASAL | 5 refills | Status: AC | PRN

## 2019-05-16 MED ORDER — FLOVENT HFA 110 MCG/ACT IN AERO: 2.0000 | INHALATION_SPRAY | Freq: Two times a day (BID) | RESPIRATORY_TRACT | 5 refills | Status: AC

## 2019-05-16 MED ORDER — PAZEO 0.7 % OP SOLN: 1.0000 [drp] | Freq: Every day | OPHTHALMIC | 5 refills | Status: AC | PRN

## 2019-05-16 MED ORDER — EPINEPHRINE 0.3 MG/0.3ML IJ SOAJ: INTRAMUSCULAR | 1 refills | Status: AC

## 2019-05-16 NOTE — Assessment & Plan Note (Signed)
   Treatment plan as outlined above for allergic rhinitis.  A prescription has been provided for Pazeo, one drop per eye daily as needed.  I have also recommended eye lubricant drops (i.e., Natural Tears) as needed. 

## 2019-05-16 NOTE — Assessment & Plan Note (Addendum)
   Aeroallergen avoidance measures have been discussed and provided in written form.  A prescription has been provided for levocetirizine, 5 mg daily as needed.  A prescription has been provided for fluticasone nasal spray, 2 sprays per nostril daily as needed. Proper nasal spray technique has been discussed and demonstrated.  Nasal saline spray (i.e., Simply Saline) or nasal saline lavage (i.e., NeilMed) is recommended as needed and prior to medicated nasal sprays.  If allergen avoidance measures and medications fail to adequately relieve symptoms, aeroallergen immunotherapy will be considered. 

## 2019-05-16 NOTE — Patient Instructions (Addendum)
Mild persistent asthma Currently with suboptimal control.  In addition, todays spirometry results, assessed while asymptomatic, suggest under-perception of bronchoconstriction.  A prescription has been provided for Flovent (fluticasone) 110 g, 2 inhalations twice a day. To maximize pulmonary deposition, a spacer has been provided along with instructions for its proper administration with an HFA inhaler.  During respiratory tract infections or asthma flares, increase Flovent 110g to 3 inhalations 3 times per day until symptoms have returned to baseline.  A refill prescription has been provided for albuterol HFA, 1 to 2 inhalations every 4-6 hours if needed and 15 minutes prior to vigorous exercise.  Subjective and objective measures of pulmonary function will be followed and the treatment plan will be adjusted accordingly.  Food allergy  Continue meticulous avoidance of shellfish and have access to epinephrine autoinjector 2 pack in case of accidental ingestion.  Food allergy action plan is in place.  A prescription has been provided for epinephrine 0.3 mg autoinjector (AuviQ) 2 pack along with instructions for its proper administration.  Perennial and seasonal allergic rhinitis  Aeroallergen avoidance measures have been discussed and provided in written form.  A prescription has been provided for levocetirizine, 5 mg daily as needed.  A prescription has been provided for fluticasone nasal spray, 2 sprays per nostril daily as needed. Proper nasal spray technique has been discussed and demonstrated.  Nasal saline spray (i.e., Simply Saline) or nasal saline lavage (i.e., NeilMed) is recommended as needed and prior to medicated nasal sprays.  If allergen avoidance measures and medications fail to adequately relieve symptoms, aeroallergen immunotherapy will be considered.  Allergic conjunctivitis  Treatment plan as outlined above for allergic rhinitis.  A prescription has been  provided for Pazeo, one drop per eye daily as needed.  I have also recommended eye lubricant drops (i.e., Natural Tears) as needed.  Atopic dermatitis  Appropriate skin care recommendations have been provided verbally and in written form.  A prescription has been provided for desonide 0.05% ointment sparingly to affected areas twice daily as needed. Care is to be taken to avoid the axillae and groin area.  The patient has been asked to make note of any foods that trigger symptom flares.  Fingernails are to be kept trimmed.   Return in about 2 months (around 07/16/2019), or if symptoms worsen or fail to improve.  Control of House Dust Mite Allergen  House dust mites play a major role in allergic asthma and rhinitis.  They occur in environments with high humidity wherever human skin, the food for dust mites is found. High levels have been detected in dust obtained from mattresses, pillows, carpets, upholstered furniture, bed covers, clothes and soft toys.  The principal allergen of the house dust mite is found in its feces.  A gram of dust may contain 1,000 mites and 250,000 fecal particles.  Mite antigen is easily measured in the air during house cleaning activities.    1. Encase mattresses, including the box spring, and pillow, in an air tight cover.  Seal the zipper end of the encased mattresses with wide adhesive tape. 2. Wash the bedding in water of 130 degrees Farenheit weekly.  Avoid cotton comforters/quilts and flannel bedding: the most ideal bed covering is the dacron comforter. 3. Remove all upholstered furniture from the bedroom. 4. Remove carpets, carpet padding, rugs, and non-washable window drapes from the bedroom.  Wash drapes weekly or use plastic window coverings. 5. Remove all non-washable stuffed toys from the bedroom.  Wash stuffed toys weekly. 6.  Have the room cleaned frequently with a vacuum cleaner and a damp dust-mop.  The patient should not be in a room which is  being cleaned and should wait 1 hour after cleaning before going into the room. 7. Close and seal all heating outlets in the bedroom.  Otherwise, the room will become filled with dust-laden air.  An electric heater can be used to heat the room. 8. Reduce indoor humidity to less than 50%.  Do not use a humidifier.  Reducing Pollen Exposure  The American Academy of Allergy, Asthma and Immunology suggests the following steps to reduce your exposure to pollen during allergy seasons.    1. Do not hang sheets or clothing out to dry; pollen may collect on these items. 2. Do not mow lawns or spend time around freshly cut grass; mowing stirs up pollen. 3. Keep windows closed at night.  Keep car windows closed while driving. 4. Minimize morning activities outdoors, a time when pollen counts are usually at their highest. 5. Stay indoors as much as possible when pollen counts or humidity is high and on windy days when pollen tends to remain in the air longer. 6. Use air conditioning when possible.  Many air conditioners have filters that trap the pollen spores. 7. Use a HEPA room air filter to remove pollen form the indoor air you breathe.   Control of Dog or Cat Allergen  Avoidance is the best way to manage a dog or cat allergy. If you have a dog or cat and are allergic to dog or cats, consider removing the dog or cat from the home. If you have a dog or cat but don't want to find it a new home, or if your family wants a pet even though someone in the household is allergic, here are some strategies that may help keep symptoms at bay:  1. Keep the pet out of your bedroom and restrict it to only a few rooms. Be advised that keeping the dog or cat in only one room will not limit the allergens to that room. 2. Don't pet, hug or kiss the dog or cat; if you do, wash your hands with soap and water. 3. High-efficiency particulate air (HEPA) cleaners run continuously in a bedroom or living room can reduce allergen  levels over time. 4. Place electrostatic material sheet in the air inlet vent in the bedroom. 5. Regular use of a high-efficiency vacuum cleaner or a central vacuum can reduce allergen levels. 6. Giving your dog or cat a bath at least once a week can reduce airborne allergen.  Control of Mold Allergen  Mold and fungi can grow on a variety of surfaces provided certain temperature and moisture conditions exist.  Outdoor molds grow on plants, decaying vegetation and soil.  The major outdoor mold, Alternaria and Cladosporium, are found in very high numbers during hot and dry conditions.  Generally, a late Summer - Fall peak is seen for common outdoor fungal spores.  Rain will temporarily lower outdoor mold spore count, but counts rise rapidly when the rainy period ends.  The most important indoor molds are Aspergillus and Penicillium.  Dark, humid and poorly ventilated basements are ideal sites for mold growth.  The next most common sites of mold growth are the bathroom and the kitchen.  Outdoor MicrosoftMold Control 1. Use air conditioning and keep windows closed 2. Avoid exposure to decaying vegetation. 3. Avoid leaf raking. 4. Avoid grain handling. 5. Consider wearing a face mask if working  in moldy areas.  Indoor Mold Control 1. Maintain humidity below 50%. 2. Clean washable surfaces with 5% bleach solution. 3. Remove sources e.g. Contaminated carpets.  Control of Cockroach Allergen  Cockroach allergen has been identified as an important cause of acute attacks of asthma, especially in urban settings.  There are fifty-five species of cockroach that exist in the [place], however only three, the [place], [place] species produce allergen that can affect patients with Asthma.  Allergens can be obtained from fecal particles, egg casings and secretions from cockroaches.    1. Remove food sources. 2. Reduce access to water. 3. Seal access and entry points. 4. Spray runways with 0.5-1%  Diazinon or Chlorpyrifos 5. Blow boric acid power under stoves and refrigerator. 6. Place bait stations (hydramethylnon) at feeding sites.  --------------------------------------------------------------------------  ECZEMA SKIN CARE REGIMEN:  Bathe and soak for 10 minutes in warm water once today. Pat dry.  Immediately apply the below emollients: To healthy skin apply Aquaphor or Vaseline jelly twice a day. To affected areas, apply: . Desonide 0.05% ointment twice a day as needed. . With ointments be careful to avoid the armpits and groin area. Note of any foods make the eczema worse. Keep finger nails trimmed and filed.

## 2019-05-16 NOTE — Assessment & Plan Note (Signed)
   Continue meticulous avoidance of shellfish and have access to epinephrine autoinjector 2 pack in case of accidental ingestion.  Food allergy action plan is in place.  A prescription has been provided for epinephrine 0.3 mg auto-injector (AuviQ) 2 pack along with instructions for its proper administration. 

## 2019-05-16 NOTE — Assessment & Plan Note (Addendum)
Currently with suboptimal control.  In addition, todays spirometry results, assessed while asymptomatic, suggest under-perception of bronchoconstriction.  A prescription has been provided for Flovent (fluticasone) 110 g, 2 inhalations twice a day. To maximize pulmonary deposition, a spacer has been provided along with instructions for its proper administration with an HFA inhaler.  During respiratory tract infections or asthma flares, increase Flovent 110g to 3 inhalations 3 times per day until symptoms have returned to baseline.  A refill prescription has been provided for albuterol HFA, 1 to 2 inhalations every 4-6 hours if needed and 15 minutes prior to vigorous exercise.  Subjective and objective measures of pulmonary function will be followed and the treatment plan will be adjusted accordingly.

## 2019-05-16 NOTE — Progress Notes (Signed)
New Patient Note  RE: Christina Dieselshley Sweeden MRN: [id number] DOB: 02/03/1996 Date of Office Visit: 05/16/2019  Referring provider: Garnette GunnerArlauskas, Victoria Ann, MD Primary care provider: Valda LambArlauskas, Victoria Anne, MD  Chief Complaint: Asthma, Allergic Reaction, and Allergic Rhinitis    History of present illness: Christina Cain is a 23 y.o. female seen today in consultation requested by Garnette GunnerVictoria Ann Arlauskas, MD.  She reports that since childhood she has experienced symptoms consistent with asthma.  She was diagnosed with asthma in 2018.  Her asthma symptoms consist of wheezing, chest tightness, dyspnea, and coughing.  Her asthma triggers include pollen exposure, vigorous exercise, and being around cooked shrimp.  Her asthma had been controlled with Qvar RediHaler, however she ran out of this medication in 2019.  Recently she has been experiencing asthma symptoms a few times per week on average.  She does not experience nocturnal awakenings due to lower respiratory symptoms.   Morrie SheldonAshley experiences nasal nasal and ocular allergy symptoms which are most pronounced in the springtime and in the fall.  She attempted to control the symptoms with cetirizine. She reports that when she was 23 years old she consumed shrimp and afterwards became lethargic and developed hives.  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  She currently does not have an epinephrine autoinjector. She has eczema which typically involves the hips, chest, and/or antecubital fossae.  The eczema is mild and typically is triggered by heat.  Otherwise, no specific food or environmental triggers have been identified which seem to correlate with eczema flares.  Assessment and plan: Mild persistent asthma Currently with suboptimal control.   In addition, todays spirometry results, assessed while asymptomatic, suggest under-perception of bronchoconstriction.  A prescription has been provided for Flovent (fluticasone) 110 g, 2 inhalations twice a day. To maximize pulmonary deposition, a spacer has been provided along with instructions for its proper administration with an HFA inhaler.  During respiratory tract infections or asthma flares, increase Flovent 110g to 3 inhalations 3 times per day until symptoms have returned to baseline.  A refill prescription has been provided for albuterol HFA, 1 to 2 inhalations every 4-6 hours if needed and 15 minutes prior to vigorous exercise.  Subjective and objective measures of pulmonary function will be followed and the treatment plan will be adjusted accordingly.  Food allergy  Continue meticulous avoidance of shellfish and have access to epinephrine autoinjector 2 pack in case of accidental ingestion.  Food allergy action plan is in place.  A prescription has been provided for epinephrine 0.3 mg autoinjector (AuviQ) 2 pack along with instructions for its proper administration.  Perennial and seasonal allergic rhinitis  Aeroallergen avoidance measures have been discussed and provided in written form.  A prescription has been provided for levocetirizine, 5 mg daily as needed.  A prescription has been provided for fluticasone nasal spray, 2 sprays per nostril daily as needed. Proper nasal spray technique has been discussed and demonstrated.  Nasal saline spray (i.e., Simply Saline) or nasal saline lavage (i.e., NeilMed) is recommended as needed and prior to medicated nasal sprays.  If allergen avoidance measures and medications fail to adequately relieve symptoms, aeroallergen immunotherapy will be considered.  Allergic conjunctivitis  Treatment plan as outlined above for allergic rhinitis.  A prescription has been provided for Pazeo, one drop per eye daily as needed.  I  have  also recommended eye lubricant drops (i.e., Natural Tears) as needed.  Atopic dermatitis  Appropriate skin care recommendations have been provided verbally and in written form.  A prescription has been provided for desonide 0.05% ointment sparingly to affected areas twice daily as needed. Care is to be taken to avoid the axillae and groin area.  The patient has been asked to make note of any foods that trigger symptom flares.  Fingernails are to be kept trimmed.   Meds ordered this encounter  Medications  . EPINEPHrine (AUVI-Q) 0.3 mg/0.3 mL IJ SOAJ injection    Sig: Use as directed for severe allergic reactions    Dispense:  4 each    Refill:  1  . fluticasone (FLOVENT HFA) 110 MCG/ACT inhaler    Sig: Inhale 2 puffs into the lungs 2 (two) times daily.    Dispense:  1 Inhaler    Refill:  5  . albuterol (VENTOLIN HFA) 108 (90 Base) MCG/ACT inhaler    Sig: Inhale 2 puffs into the lungs every 6 (six) hours as needed for wheezing or shortness of breath.    Dispense:  8 g    Refill:  1  . levocetirizine (XYZAL) 5 MG tablet    Sig: Take 1 tablet (5 mg total) by mouth every evening.    Dispense:  30 tablet    Refill:  5  . fluticasone (FLONASE) 50 MCG/ACT nasal spray    Sig: Place 2 sprays into both nostrils daily as needed for allergies or rhinitis.    Dispense:  18.2 mL    Refill:  5  . Olopatadine HCl (PAZEO) 0.7 % SOLN    Sig: Place 1 drop into both eyes daily as needed.    Dispense:  2.5 mL    Refill:  5  . desonide (DESOWEN) 0.05 % ointment    Sig: Apply sparingly to affected areas twice daily as needed avoiding the axillae and groin areas    Dispense:  45 g    Refill:  3    Diagnostics: Spirometry: FVC was 3.38 L and FEV1 was 2.75 L (82% predicted) with significant (390 mL, 15%) postbronchodilator improvement.  This study was performed while the patient was asymptomatic.  Please see scanned spirometry results for details. Environmental skin testing: Positive to grass  pollen, weed pollen, ragweed pollen, tree pollen, molds, cat hair, dog epithelia, cockroach antigen, and dust mite antigen. Food allergen skin testing: Borderline positive to shrimp and oyster.    Physical examination: Blood pressure 132/76, pulse 68, temperature 98.4 F (36.9 C), temperature source Oral, resp. rate 16, height 5\' 7"  (1.702 m), weight 173 lb 4.5 oz (78.6 kg), SpO2 99 %.  General: Alert, interactive, in no acute distress. HEENT: TMs pearly gray, turbinates moderately edematous with clear discharge, post-pharynx erythematous. Neck: Supple without lymphadenopathy. Lungs: Clear to auscultation without wheezing, rhonchi or rales. CV: Normal S1, S2 without murmurs. Abdomen: Nondistended, nontender. Skin: Mild hyperpigmentation of the antecubital fossa. Extremities:  No clubbing, cyanosis or edema. Neuro:   Grossly intact.  Review of systems:  Review of systems negative except as noted in HPI / PMHx or noted below: Review of Systems  Constitutional: Negative.   HENT: Negative.   Eyes: Negative.   Respiratory: Negative.   Cardiovascular: Negative.   Gastrointestinal: Negative.   Genitourinary: Negative.   Musculoskeletal: Negative.   Skin: Negative.   Neurological: Negative.   Endo/Heme/Allergies: Negative.   Psychiatric/Behavioral: Negative.     Past medical history:  Past Medical  History:  Diagnosis Date  . Asthma   . Eczema   . Food allergy   . Urticaria     Past surgical history:  Past Surgical History:  Procedure Laterality Date  . TONSILLECTOMY      Family history: Family History  Problem Relation Age of Onset  . Allergic rhinitis Mother   . Asthma Sister   . Asthma Brother   . Angioedema Neg Hx   . Eczema Neg Hx   . Immunodeficiency Neg Hx   . Urticaria Neg Hx     Social history: Social History   Socioeconomic History  . Marital status: Single    Spouse name: Not on file  . Number of children: Not on file  . Years of education: Not  on file  . Highest education level: Not on file  Occupational History  . Not on file  Social Needs  . Financial resource strain: Not on file  . Food insecurity    Worry: Not on file    Inability: Not on file  . Transportation needs    Medical: Not on file    Non-medical: Not on file  Tobacco Use  . Smoking status: Never Smoker  . Smokeless tobacco: Never Used  Substance and Sexual Activity  . Alcohol use: Yes  . Drug use: No  . Sexual activity: Not on file  Lifestyle  . Physical activity    Days per week: Not on file    Minutes per session: Not on file  . Stress: Not on file  Relationships  . Social Musicianconnections    Talks on phone: Not on file    Gets together: Not on file    Attends religious service: Not on file    Active member of club or organization: Not on file    Attends meetings of clubs or organizations: Not on file    Relationship status: Not on file  . Intimate partner violence    Fear of current or ex partner: Not on file    Emotionally abused: Not on file    Physically abused: Not on file    Forced sexual activity: Not on file  Other Topics Concern  . Not on file  Social History Narrative  . Not on file   Environmental History: The patient lives in an apartment with carpeting throughout and central air/heat.  There is no known mold/water damage in the home.  She is a non-smoker without pets.  Allergies as of 05/16/2019      Reactions   Shrimp [shellfish Allergy] Anaphylaxis   Iodinated Diagnostic Agents    Milk Protein    Other       Medication List       Accurate as of May 16, 2019  4:45 PM. If you have any questions, ask your nurse or doctor.        acetaminophen 500 MG tablet Commonly known as: Tylenol Take 1 tablet (500 mg total) by mouth every 6 (six) hours as needed for mild pain.   cetirizine 10 MG chewable tablet Commonly known as: ZYRTEC Chew 1 tablet (10 mg total) by mouth daily.   desonide 0.05 % ointment Commonly known as:  DESOWEN Apply sparingly to affected areas twice daily as needed avoiding the axillae and groin areas Started by: Wellington Hampshire Carter Sevana Grandinetti, MD   EPINEPHrine 0.3 mg/0.3 mL Soaj injection Commonly known as: EPI-PEN Inject into the muscle. What changed: Another medication with the same name was added. Make sure you understand how  and when to take each. Changed by: Wellington Hampshire Carter Nakesha Ebrahim, MD   EPINEPHrine 0.3 mg/0.3 mL Soaj injection Commonly known as: Auvi-Q Use as directed for severe allergic reactions What changed: You were already taking a medication with the same name, and this prescription was added. Make sure you understand how and when to take each. Changed by: Wellington Hampshire Carter Crit Obremski, MD   etonogestrel-ethinyl estradiol 0.12-0.015 MG/24HR vaginal ring Commonly known as: NUVARING Insert vaginally and leave in place for 3 consecutive weeks, then remove for 1 week.   Flovent HFA 110 MCG/ACT inhaler Generic drug: fluticasone Inhale 2 puffs into the lungs 2 (two) times daily. Started by: Wellington Hampshire Carter Jaion Lagrange, MD   fluticasone 50 MCG/ACT nasal spray Commonly known as: FLONASE Place 2 sprays into both nostrils daily as needed for allergies or rhinitis. Started by: Wellington Hampshire Carter Macy Lingenfelter, MD   levocetirizine 5 MG tablet Commonly known as: XYZAL Take 1 tablet (5 mg total) by mouth every evening. Started by: Wellington Hampshire Carter Oluwatoni Rotunno, MD   Pazeo 0.7 % Soln Generic drug: Olopatadine HCl Place 1 drop into both eyes daily as needed. Started by: Wellington Hampshire Carter Cisco Kindt, MD   PROAIR HFA IN Inhale into the lungs. What changed: Another medication with the same name was changed. Make sure you understand how and when to take each. Changed by: Wellington Hampshire Carter Tzvi Economou, MD   albuterol 108 (90 Base) MCG/ACT inhaler Commonly known as: VENTOLIN HFA Inhale 2 puffs into the lungs every 6 (six) hours as needed for wheezing or shortness of breath. What changed: how much to take Changed by: R Jorene Guestarter Diego Delancey, MD       Known medication  allergies: Allergies  Allergen Reactions  . Shrimp [Shellfish Allergy] Anaphylaxis  . Iodinated Diagnostic Agents   . Milk Protein   . Other     I appreciate the opportunity to take part in Yanilen's care. Please do not hesitate to contact me with questions.  Sincerely,   R. Jorene Guestarter Mickey Hebel, MD

## 2019-05-16 NOTE — Assessment & Plan Note (Signed)
   Appropriate skin care recommendations have been provided verbally and in written form.  A prescription has been provided for desonide 0.05% ointment sparingly to affected areas twice daily as needed. Care is to be taken to avoid the axillae and groin area.  The patient has been asked to make note of any foods that trigger symptom flares.  Fingernails are to be kept trimmed.

## 2019-07-18 ENCOUNTER — Ambulatory Visit: Payer: BC Managed Care – PPO | Admitting: Allergy and Immunology
